# Patient Record
Sex: Female | Born: 2000 | Race: White | Hispanic: No | Marital: Single | Smoking: Never smoker
Health system: Southern US, Community
[De-identification: ages and names within clinical notes are randomized; demographics above are authoritative.]

## PROBLEM LIST (undated history)

## (undated) DIAGNOSIS — N83202 Unspecified ovarian cyst, left side: Secondary | ICD-10-CM

## (undated) HISTORY — PX: WISDOM TOOTH EXTRACTION: SHX21

---
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
## 2021-03-09 ENCOUNTER — Ambulatory Visit: Admit: 2021-03-09 | Discharge: 2021-03-09 | Payer: BLUE CROSS/BLUE SHIELD | Attending: Nurse Practitioner

## 2021-03-09 ENCOUNTER — Inpatient Hospital Stay: Admit: 2021-03-09 | Payer: BLUE CROSS/BLUE SHIELD

## 2021-03-09 DIAGNOSIS — R634 Abnormal weight loss: Secondary | ICD-10-CM

## 2021-03-09 NOTE — Progress Notes (Signed)
[facility]  [facility] Encounter        ASSESSMENT/PLAN     Toni Cox is a 20 y.o. female who presents with:  Patient noticing continued weight loss.  Has lost a total of 10 pounds in the last 8 months 3 pounds over the last 2 months.  She has been trying to gain weight however has not had loss of appetite is only eating 1 or 2 bites of food at each meal.  She denies fatigue, night sweats or chills.  Denies nausea or vomiting pain with urination urinary frequency, heart palpitations, dizziness or feeling of confusion.  Denies headaches.  She denies pain in any location.  Depression and anxiety screenings are negative.  She does not participate in athletic activities or working out on a regular basis denies any increase in physical activity recently.  On examination ears appear normal bilaterally.  Pharynx pink moist no tonsillar enlargement.  Neck is supple there are no masses lung sounds are clear throughout heart sounds normal and regular.  Abdomen is smooth and soft bowel sounds within normal limits.  Nontender on palpation.  Denies CVA tenderness.  No skin rashes bruising or lesions noted.      1. Weight loss    2. Loss of appetite      Orders for TSH CBC CMP A1c B12 and folate.     PATIENT REFERRED TO:  Return in about 3 weeks (around 03/30/2021) for Follow up with PCP.    DISCHARGE MEDICATIONS:  New Prescriptions    No medications on file     Cannot display discharge medications since this is not an admission.       Clabe Seal, APRN - CNP    CHIEF COMPLAINT       Chief Complaint   Patient presents with    Weight Loss     Weight loss, loss of appetite         SUBJECTIVE/REVIEW OF SYSTEMS     Review of Systems   Constitutional:  Positive for appetite change and unexpected weight change. Negative for chills and fatigue.   HENT:  Negative for congestion, ear pain, hearing loss, rhinorrhea, sore throat and trouble swallowing.    Eyes:  Negative for pain and visual  disturbance.   Respiratory:  Negative for cough, chest tightness and shortness of breath.    Cardiovascular:  Negative for chest pain, palpitations and leg swelling.   Gastrointestinal:  Negative for abdominal pain, blood in stool, constipation, diarrhea, nausea and vomiting.   Endocrine: Negative for polyphagia and polyuria.   Genitourinary:  Negative for difficulty urinating, dysuria, flank pain, frequency and hematuria.   Musculoskeletal:  Negative for arthralgias, myalgias and neck stiffness.   Skin:  Negative for color change, rash and wound.   Neurological:  Negative for dizziness, weakness, light-headedness, numbness and headaches.   Hematological:  Negative for adenopathy. Does not bruise/bleed easily.   Psychiatric/Behavioral:  Negative for confusion, hallucinations and sleep disturbance. The patient is not nervous/anxious.      OBJECTIVE/PHYSICAL EXAM     Physical Exam  Constitutional:       General: She is not in acute distress.     Appearance: Normal appearance. She is not ill-appearing.   HENT:      Head: Normocephalic.      Right Ear: Tympanic membrane normal. There is no impacted cerumen.      Left Ear: Tympanic membrane normal. There is no impacted cerumen.  Nose: Nose normal. No congestion.      Mouth/Throat:      Mouth: Mucous membranes are moist.      Pharynx: No oropharyngeal exudate or posterior oropharyngeal erythema.      Tonsils: No tonsillar exudate or tonsillar abscesses. 0 on the right. 0 on the left.   Eyes:      General:         Right eye: No discharge.         Left eye: No discharge.      Conjunctiva/sclera: Conjunctivae normal.      Pupils: Pupils are equal, round, and reactive to light.   Neck:      Thyroid: No thyroid mass, thyromegaly or thyroid tenderness.   Cardiovascular:      Rate and Rhythm: Normal rate.      Pulses: Normal pulses.      Heart sounds: Normal heart sounds. No murmur heard.    No friction rub. No gallop.   Pulmonary:      Effort: Pulmonary effort is normal. No  respiratory distress.      Breath sounds: Normal breath sounds. No wheezing, rhonchi or rales.   Chest:      Chest wall: No tenderness.   Abdominal:      General: Bowel sounds are normal. There is no distension.      Tenderness: There is no abdominal tenderness. There is no right CVA tenderness, left CVA tenderness or guarding.   Musculoskeletal:         General: No swelling, tenderness, deformity or signs of injury. Normal range of motion.      Cervical back: Normal range of motion and neck supple. No rigidity or tenderness.   Lymphadenopathy:      Cervical: Cervical adenopathy present.      Right cervical: No superficial, deep or posterior cervical adenopathy.     Left cervical: No superficial, deep or posterior cervical adenopathy.   Skin:     General: Skin is warm and dry.      Capillary Refill: Capillary refill takes less than 2 seconds.      Coloration: Skin is not pale.      Findings: No bruising or erythema.   Neurological:      General: No focal deficit present.      Mental Status: She is alert and oriented to person, place, and time. Mental status is at baseline.      Motor: No weakness.      Coordination: Coordination normal.      Gait: Gait normal.   Psychiatric:         Mood and Affect: Mood normal.         Behavior: Behavior normal.       VITALS  BP:  (unable to obtain), Temp: 97.3 ??F (36.3 ??C), Temp Source: Temporal, Heart Rate: 83,  , SpO2: 98 %      PAST MEDICAL HISTORY   History reviewed. No pertinent past medical history.  SURGICAL HISTORY     Patient  has a past surgical history that includes ovarian cyst removal.  CURRENT MEDICATIONS       Previous Medications    IBUPROFEN (ADVIL;MOTRIN) 600 MG TABLET    Take 1 tablet by mouth every 8 hours as needed for Pain    MEDROXYPROGESTERONE (PROVERA) 10 MG TABLET    Take 1 tablet by mouth daily    MUPIROCIN (BACTROBAN) 2 % OINTMENT    Apply topically 2 times daily to right shin x7  to 10 days.     ALLERGIES     Patient is has No Known Allergies.  FAMILY  HISTORY     Patient'sfamily history is not on file.  HISTORY     Patient  reports that she has never smoked. She has never been exposed to tobacco smoke. She has never used smokeless tobacco. She reports that she does not drink alcohol and does not use drugs.  READY CARE COURSE     Orders Placed This Encounter   Procedures    CBC with Auto Differential     Standing Status:   Future     Number of Occurrences:   1     Standing Expiration Date:   03/09/2022    TSH with Reflex     Standing Status:   Future     Number of Occurrences:   1     Standing Expiration Date:   03/09/2022    Vitamin B12 & Folate     Standing Status:   Future     Number of Occurrences:   1     Standing Expiration Date:   03/09/2022    Hemoglobin A1C     Standing Status:   Future     Number of Occurrences:   1     Standing Expiration Date:   03/09/2022        Labs:  No results found for this visit on 03/09/21.  IMAGING:  No orders to display     Scheduled Meds:  Continuous Infusions:  PRN Meds:.

## 2021-03-09 NOTE — Other (Signed)
Please contact pt and let them know the hemoglobin A1c blood count thyroid-stimulating hormone and B12 and folate levels are all in normal range.  Continue with plan to follow-up with PCP at first available appointment for further evaluation

## 2021-03-10 LAB — CBC WITH AUTO DIFFERENTIAL
Basophils %: 0.5 %
Basophils Absolute: 0 10*3/uL (ref 0.0–0.2)
Eosinophils %: 0.9 %
Eosinophils Absolute: 0 10*3/uL (ref 0.0–0.7)
Hematocrit: 39.9 % (ref 37.0–47.0)
Hemoglobin: 13.2 g/dL (ref 12.0–16.0)
Lymphocytes %: 32.2 %
Lymphocytes Absolute: 1.6 10*3/uL (ref 1.0–4.8)
MCH: 30.4 pg (ref 27.0–31.3)
MCHC: 33.2 % (ref 33.0–37.0)
MCV: 91.6 fL (ref 79.4–94.8)
Monocytes %: 7.1 %
Monocytes Absolute: 0.4 10*3/uL (ref 0.2–0.8)
Neutrophils %: 59.3 %
Neutrophils Absolute: 3 10*3/uL (ref 1.4–6.5)
Platelets: 234 10*3/uL (ref 130–400)
RBC: 4.36 M/uL (ref 4.20–5.40)
RDW: 12.1 % (ref 11.5–14.5)
WBC: 5.1 10*3/uL (ref 4.5–11.0)

## 2021-03-10 LAB — VITAMIN B12 & FOLATE
Folate: 15.5 ng/mL (ref >4.8)
Vitamin B-12: 354 pg/mL (ref 232–1245)

## 2021-03-10 LAB — TSH WITH REFLEX: TSH: 1.08 u[IU]/mL (ref 0.440–3.860)

## 2021-03-10 LAB — HEMOGLOBIN A1C: Hemoglobin A1C: 5.4 % (ref 4.8–5.9)

## 2022-02-17 ENCOUNTER — Inpatient Hospital Stay
Admit: 2022-02-17 | Discharge: 2022-02-17 | Disposition: A | Discharge status: Home / Self Care | Payer: BLUE CROSS/BLUE SHIELD | Attending: Emergency Medicine

## 2022-02-17 ENCOUNTER — Emergency Department: Admit: 2022-02-17 | Payer: BLUE CROSS/BLUE SHIELD

## 2022-02-17 DIAGNOSIS — S81011A Laceration without foreign body, right knee, initial encounter: Secondary | ICD-10-CM

## 2022-02-17 DIAGNOSIS — S8001XA Contusion of right knee, initial encounter: Secondary | ICD-10-CM

## 2022-02-17 MED ORDER — BACITRACIN 500 UNIT/GM EX OINT
500 UNIT/GM | Freq: Once | CUTANEOUS | Status: AC
Start: 2022-02-17 — End: 2022-02-17
  Administered 2022-02-17: 23:00:00 via TOPICAL

## 2022-02-17 MED FILL — BACITRACIN 500 UNIT/GM EX OINT: 500 UNIT/GM | CUTANEOUS | Qty: 1
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
## 2022-02-17 NOTE — ED Notes (Signed)
Cleaned wound with betadine and saline      [address] [place], [place]  02/17/22 1635

## 2022-02-17 NOTE — ED Notes (Signed)
Approx 1.5 cm lac noted to right knee bleeding controlled  pt shows no distress      Toni Cox, [place]  02/17/22 1636

## 2022-02-17 NOTE — ED Notes (Signed)
Bacitracin and dressing applied  along with ace wrap      Dietrich Ke [place], RN  02/17/22 1744

## 2022-02-25 ENCOUNTER — Ambulatory Visit: Admit: 2022-02-25 | Discharge: 2022-02-25 | Payer: BLUE CROSS/BLUE SHIELD | Attending: Nurse Practitioner

## 2022-02-25 DIAGNOSIS — Z4802 Encounter for removal of sutures: Secondary | ICD-10-CM
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
## 2022-02-25 NOTE — Patient Instructions (Signed)
Watch for increasing warmth redness swelling and discharge which may be a sign of infection and return if these symptoms appear.

## 2022-05-06 MED ORDER — MEDROXYPROGESTERONE ACETATE 10 MG PO TABS
10 MG | ORAL_TABLET | Freq: Every day | ORAL | 3 refills | Status: AC
Start: 2022-05-06 — End: ?
